# Patient Record
Sex: Female | Born: 1995 | Race: White | Hispanic: No | Marital: Single | Smoking: Current every day smoker
Health system: Southern US, Community
[De-identification: ages and names within clinical notes are randomized; demographics above are authoritative.]

## PROBLEM LIST (undated history)

## (undated) DIAGNOSIS — F411 Generalized anxiety disorder: Secondary | ICD-10-CM

## (undated) DIAGNOSIS — F900 Attention-deficit hyperactivity disorder, predominantly inattentive type: Secondary | ICD-10-CM

## (undated) HISTORY — DX: Generalized anxiety disorder: F41.1

## (undated) HISTORY — DX: Attention-deficit hyperactivity disorder, predominantly inattentive type: F90.0

---
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
## 2018-01-22 ENCOUNTER — Encounter: Payer: Self-pay | Admitting: Internal Medicine

## 2018-01-28 DIAGNOSIS — R002 Palpitations: Secondary | ICD-10-CM

## 2018-01-28 DIAGNOSIS — R000 Tachycardia, unspecified: Secondary | ICD-10-CM | POA: Insufficient documentation

## 2018-01-29 ENCOUNTER — Encounter: Payer: Self-pay | Admitting: Cardiology

## 2018-01-29 ENCOUNTER — Ambulatory Visit (INDEPENDENT_AMBULATORY_CARE_PROVIDER_SITE_OTHER): Payer: PRIVATE HEALTH INSURANCE | Admitting: Cardiology

## 2018-01-29 VITALS — BP 120/78 | HR 125 | Ht 66.75 in | Wt 138.4 lb

## 2018-01-29 DIAGNOSIS — R002 Palpitations: Secondary | ICD-10-CM

## 2018-01-29 DIAGNOSIS — F411 Generalized anxiety disorder: Secondary | ICD-10-CM | POA: Diagnosis not present

## 2018-01-29 DIAGNOSIS — R000 Tachycardia, unspecified: Secondary | ICD-10-CM

## 2018-01-29 NOTE — Progress Notes (Signed)
Cardiology Consultation:    Date:  01/29/2018   ID:  Michelle Robles, DOB October 21, 1995, MRN [id number]  PCP:  Michelle Montana, MD  Cardiologist:  Michelle Balsam, MD   Referring MD: Michelle Montana, MD   Chief Complaint  Patient presents with  . Tachycardia  Palpitations  History of Present Illness:    Michelle Robles is a 22 y.o. female who is being seen today for the evaluation of palpitations at the request of Michelle Montana, MD.  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  He was referred to [place] because of palpitations.  She is aware of her heart beating forcefully and fast.  Recently she was also diagnosed with depression.  She takes left 50 mg daily as well as Adderall.  She denies having any irregularity of the heart rate.  There is no shortness of breath no chest pain associated with it but she is very anxious when she has a sensation.  She is to exercise on the regular basis with no difficulty but lately have simply not time to do that.  She has to walk a lot while in [place] and have no difficulty doing it.   Past Medical History:  Diagnosis Date  . Anxiety state 12/01/2014  . 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 ADENOIDECTOMY Bilateral 2009    Current Medications: Current Meds  Medication Sig  . amphetamine-dextroamphetamine (ADDERALL) 10 MG tablet Take 10 mg by mouth 2 (two) times daily with a meal.  . cephALEXin (KEFLEX) 500 MG capsule Take 500 mg by mouth 3 (three) times daily.  . hydrOXYzine (ATARAX/VISTARIL) 25 MG tablet Take 12.5 mg by mouth as needed (Sleep).  . Levonorgestrel-Ethinyl Estrad (ALESSE, 28, PO) Take 1 tablet by mouth daily.  . sertraline (ZOLOFT) 50 MG tablet Take 50 mg by mouth daily.     Allergies:   Other and Sumatriptan   Social History   Socioeconomic History  . Marital status: Single    Spouse name: Not on file  .  Number of children: Not on file  . Years of education: Not on file  . Highest education level: Not on file  Occupational History  . Not on file  Social Needs  . Financial resource strain: Not on file  . Food insecurity:    Worry: Not on file    Inability: Not on file  . Transportation needs:    Medical: Not on file    Non-medical: Not on file  Tobacco Use  . Smoking status: Never Smoker  . Smokeless tobacco: Never Used  Substance and Sexual Activity  . Alcohol use: No  . Drug use: No  . Sexual activity: Never    Birth control/protection: Abstinence  Lifestyle  . Physical activity:    Days per week: Not on file    Minutes per session: Not on file  . Stress: Not on file  Relationships  . Social connections:    Talks on phone: Not on file    Gets together: Not on file    Attends religious service: Not on file    Active member of club or organization: Not on file    Attends meetings of clubs or organizations: Not on file    Relationship status: Not on file  Other Topics Concern  . Not on file  Social History Narrative   Michelle Robles is in twelfth grade at [employer]. She is  doing very well.    Living with her parents and brother. Works part-time at [employer].     Family History: The patient's family history includes ADD / ADHD in her father; Asperger's syndrome in her maternal uncle; Heart attack in her maternal grandmother; Stroke in her maternal grandmother. ROS:   Please see the history of present illness.    All 14 point review of systems negative except as described per history of present illness.  EKGs/Labs/Other Studies Reviewed:    The following studies were reviewed today:   EKG:  EKG is  ordered today.  The ekg ordered today demonstrates normal sinus rhythm normal P interval normal QS complex duration morphology  Recent Labs: No results found for requested labs within last 8760 hours.  Recent Lipid Panel No results found for: CHOL, TRIG, HDL,  CHOLHDL, VLDL, LDLCALC, LDLDIRECT  Physical Exam:    VS:  BP 120/78 (BP Location: Left Arm)   Pulse (!) 125   Ht 5' 6.75" (1.695 m)   Wt 138 lb 6.4 oz (62.8 kg)   SpO2 98%   BMI 21.84 kg/m     Wt Readings from Last 3 Encounters:  01/29/18 138 lb 6.4 oz (62.8 kg)  07/13/15 139 lb 12.8 oz (63.4 kg) (76 %, Z= 0.69)*  02/09/15 130 lb 12.8 oz (59.3 kg) (65 %, Z= 0.39)*   * Growth percentiles are based on CDC (Girls, 2-20 Years) data.     GEN:  Well nourished, well developed in no acute distress HEENT: Normal NECK: No JVD; No carotid bruits LYMPHATICS: No lymphadenopathy CARDIAC: RRR, no murmurs, no rubs, no gallops RESPIRATORY:  Clear to auscultation without rales, wheezing or rhonchi  ABDOMEN: Soft, non-tender, non-distended MUSCULOSKELETAL:  No edema; No deformity  SKIN: Warm and dry NEUROLOGIC:  Alert and oriented x 3 PSYCHIATRIC:  Normal affect   ASSESSMENT:    1. Palpitations   2. Tachycardia   3. Anxiety state    PLAN:    In order of problems listed above:  1. Palpitations/tachycardia.  I will ask her to have echocardiogram to check left ventricular ejection fraction.  She also will wear 48 hours Holter monitor to see if we have any significant arrhythmia.  I suspect Adderall is the one that is responsible for those symptoms.  Hopefully echocardiogram and monitor will be negative and then I will try to convince her to exercise on the regular basis and see if that helps with her heart rate.  I prefer not to use beta-blocker in her condition because of history of depression in the future we may be forced to cut down or maybe even discontinue Adderall.  We did talk about avoiding coffee and now she drinks only one cup a day.  We will talk also about avoiding caffeinated drinks. 2. Anxiety: It managed by psychiatrist and psychotherapist.   Medication Adjustments/Labs and Tests Ordered: Current medicines are reviewed at length with the patient today.  Concerns regarding  medicines are outlined above.  No orders of the defined types were placed in this encounter.  No orders of the defined types were placed in this encounter.   Signed, Georgeanna Lea, MD, [facility]. 01/29/2018 10:59 AM    [facility]

## 2018-01-29 NOTE — Patient Instructions (Addendum)
Medication Instructions:  Your physician recommends that you continue on your current medications as directed. Please refer to the Current Medication list given to you today.   Labwork: None  Testing/Procedures: Your physician has requested that you have an echocardiogram. Echocardiography is a painless test that uses sound waves to create images of your heart. It provides your doctor with information about the size and shape of your heart and how well your heart's chambers and valves are working. This procedure takes approximately one hour. There are no restrictions for this procedure.  Your physician has recommended that you wear a holter monitor. Holter monitors are medical devices that record the heart's electrical activity. Doctors most often use these monitors to diagnose arrhythmias. Arrhythmias are problems with the speed or rhythm of the heartbeat. The monitor is a small, portable device. You can wear one while you do your normal daily activities. This is usually used to diagnose what is causing palpitations/syncope (passing out). Wear for 48 hours.   Follow-Up: Your physician wants you to follow-up in: 4 months. You will receive a reminder letter in the mail two months in advance. If you don't receive a letter, please call our office to schedule the follow-up appointment.    Any Other Special Instructions Will Be Listed Below (If Applicable).     If you need a refill on your cardiac medications before your next appointment, please call your pharmacy.

## 2018-02-04 ENCOUNTER — Ambulatory Visit (HOSPITAL_COMMUNITY): Payer: PRIVATE HEALTH INSURANCE | Attending: Internal Medicine

## 2018-02-04 ENCOUNTER — Other Ambulatory Visit: Payer: Self-pay

## 2018-02-04 DIAGNOSIS — R000 Tachycardia, unspecified: Secondary | ICD-10-CM | POA: Diagnosis present

## 2018-02-04 DIAGNOSIS — R002 Palpitations: Secondary | ICD-10-CM | POA: Diagnosis not present

## 2018-02-05 ENCOUNTER — Ambulatory Visit: Payer: PRIVATE HEALTH INSURANCE

## 2018-02-05 DIAGNOSIS — R002 Palpitations: Secondary | ICD-10-CM

## 2018-02-11 ENCOUNTER — Telehealth: Payer: Self-pay | Admitting: *Deleted

## 2018-02-11 NOTE — Telephone Encounter (Signed)
Patient called asking for holter monitor results and to make [place] aware that her psychiatrist was faxing [place] some information. Patient wanted to make sure we received it. Patient wanted Dr. Bing Matter to advise whether or not she should continue taking adderall at this time. Explained to patient that Dr. Bing Matter was out of the office the rest of the week but I would have the covering physician, Dr. Dulce Sellar, review holter monitor results and get his recommendations.   Dr. Dulce Sellar advised that the holter monitor results showed some sinus tachycardia but nothing significant. Dr. Dulce Sellar did not feel comfortable making a recommendation about addrell and advised to have Dr. Bing Matter make that decision when he returned to the office on Monday. Patient informed. Patient concerned because she is going out of the town for the summer on Sunday. Patient states we can call her mother (DPR) with Dr. Vanetta Shawl recommendations on Monday and she would go from there. No further questions.

## 2018-02-15 ENCOUNTER — Encounter: Payer: Self-pay | Admitting: *Deleted

## 2018-02-16 NOTE — Telephone Encounter (Signed)
Dr. Bing Matter advised that as long as patient is not having palpitations, she can continue taking her current medications. Letter stating this faxed successfully to [facility] to attention Corie Chiquito, NP. Patient's mother per DPR, Tinnie Gens, informed of this. Jennie verbalized understanding. No further questions.

## 2018-09-08 ENCOUNTER — Encounter: Payer: Self-pay | Admitting: Emergency Medicine

## 2018-09-08 DIAGNOSIS — F419 Anxiety disorder, unspecified: Secondary | ICD-10-CM | POA: Insufficient documentation

## 2018-09-08 DIAGNOSIS — F3342 Major depressive disorder, recurrent, in full remission: Secondary | ICD-10-CM | POA: Insufficient documentation

## 2018-09-17 ENCOUNTER — Encounter: Payer: Self-pay | Admitting: Psychiatry

## 2018-09-17 ENCOUNTER — Ambulatory Visit (INDEPENDENT_AMBULATORY_CARE_PROVIDER_SITE_OTHER): Payer: PRIVATE HEALTH INSURANCE | Admitting: Psychiatry

## 2018-09-17 VITALS — BP 113/60 | HR 84

## 2018-09-17 DIAGNOSIS — F900 Attention-deficit hyperactivity disorder, predominantly inattentive type: Secondary | ICD-10-CM | POA: Diagnosis not present

## 2018-09-17 DIAGNOSIS — F3181 Bipolar II disorder: Secondary | ICD-10-CM | POA: Diagnosis not present

## 2018-09-17 DIAGNOSIS — F5101 Primary insomnia: Secondary | ICD-10-CM | POA: Diagnosis not present

## 2018-09-17 DIAGNOSIS — F419 Anxiety disorder, unspecified: Secondary | ICD-10-CM | POA: Diagnosis not present

## 2018-09-17 MED ORDER — LAMOTRIGINE 25 MG PO TABS: ORAL_TABLET | ORAL | 0 refills | Status: DC

## 2018-09-17 MED ORDER — SERTRALINE HCL 50 MG PO TABS: 75.0000 mg | ORAL_TABLET | Freq: Every day | ORAL | 2 refills | Status: DC

## 2018-09-17 MED ORDER — TRAZODONE HCL 100 MG PO TABS: ORAL_TABLET | ORAL | 2 refills | Status: DC

## 2018-09-17 MED ORDER — AMPHETAMINE-DEXTROAMPHETAMINE 10 MG PO TABS: 10.0000 mg | ORAL_TABLET | Freq: Two times a day (BID) | ORAL | 0 refills | Status: DC

## 2018-09-17 MED ORDER — AMPHETAMINE-DEXTROAMPHETAMINE 10 MG PO TABS: 10.0000 mg | ORAL_TABLET | Freq: Every day | ORAL | 0 refills | Status: DC

## 2018-09-17 NOTE — Progress Notes (Signed)
Michelle Robles [id number] 03-04-1996 22 y.o.  Subjective:   Patient ID:  Michelle Bertholdlizabeth C Edman is a 22 y.o. (DOB 03-04-1996) female.  Chief Complaint:  Chief Complaint  Patient presents with  . Other    Mood lability  . ADD  . Anxiety  . Sleeping Problem    HPI Michelle Bertholdlizabeth C Bringhurst presents to the office today for follow-up of mood, anxiety, and ADD. She reports that her mood is "up and down." Has been questioning if she has Bipolar II. Reports that periods where she is more "hyper, excited" sometimes without precipitating event or excessive considering circumstances. Denies decreased need for sleep but will struggle to fall asleep because she is excited and having ideas. Reports periods of increased energy and ideas with some excessive goal-directed activity (ie., going to school full -time and active in sorority and then was considering taking a second job). Reports that she is more talkative but not excessively talkative. Denies impulsive or risky behavior. Reports that she is "thinking more clearly" during those times. Denies excessive spending. Reports periods of elevated mood last about 3 hours on average- "the longer they last, the more mellow they are" in terms of duration and severity of mood. Reports that mood will "plummet" at times, particularly after periods where mood is slightly elevated. Reports that her mood tends to be either slightly elevated or mildly depressed, with infrequent episodes of severe depression. Reports episodes of hypomania happen about once every 1-2 weeks. Reports since Sertraline was increased mood is more labile with less periods of severe depression and more times where mood is slightly elevated.   She reports that her anxiety is "fine." Reports having thoughts that no one likes her. Reports thinking "if I don't get this done, I will fail." Reports that she occasionally feels tightness in her chest with anxiety and times of severe depression. Reports some  anxiety that one of her grandparents will die soon after her dog died recently. Reports occasionally having thought that she is going to die while driving. Continues to have some dissociation.  She reports that her concentration has not been good. She reports that she "cannot function" when she does not take Adderall and will notice that she feels tired and "foggy" when she does not take it. Reports that Adderall helps her focus on "social things" but struggles with focusing on school work.   Energy, motivation, and sleep fluctuate based on mood. Appetite also fluctuates "but I don't know when" and has been unable to determine a pattern. Reports eating excessively at times and other times will only eat one meal qd. Reports Trazodone has been helpful for her insomnia.   Reports occasional passive death wishes. Denies SI.   "If one thing bad happens, it sends me down a depression."  Reports that she started seeing a different therapist at [facility] and has had one visit.   Past Psychiatric Medication Trials: Strattera Vyvanse Adderall Vistaril Prozac Wellbutrin-no significant improvement Sertraline Melatonin Trazodone   Review of Systems:  Review of Systems  Cardiovascular:       Reports less frequent palpitations and that it has not been occurring.   Musculoskeletal: Negative for gait problem.  Neurological: Positive for tremors.       Increased tremor since increase in Sertraline  Psychiatric/Behavioral:       Please refer to HPI    Medications: I have reviewed the patient's current medications.  Current Outpatient Medications  Medication Sig Dispense Refill  . amphetamine-dextroamphetamine (ADDERALL) 10  MG tablet Take 1 tablet (10 mg total) by mouth 2 (two) times daily with a meal. 60 tablet 0  . Levonorgestrel-Ethinyl Estrad (ALESSE, 28, PO) Take 1 tablet by mouth daily.    . sertraline (ZOLOFT) 50 MG tablet Take 1.5 tablets (75 mg total) by mouth daily. 45 tablet 2   . traZODone (DESYREL) 100 MG tablet Take 1/2-1 tab po qd 30 tablet 2  . [START ON 10/15/2018] amphetamine-dextroamphetamine (ADDERALL) 10 MG tablet Take 1 tablet (10 mg total) by mouth daily with breakfast. 30 tablet 0  . lamoTRIgine (LAMICTAL) 25 MG tablet Take 1 tab po qd x 2 weeks, then increase to 2 tabs po qd 60 tablet 0   No current facility-administered medications for this visit.     Medication Side Effects: Sleep Problems  Allergies:  Allergies  Allergen Reactions  . Other     Seasonal Allergies, Avocados cause stomach pain and nausea   . Sumatriptan     Past Medical History:  Diagnosis Date  . Anxiety state 12/01/2014  . 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 Anxiety disorder Mother   . ADD / ADHD Father   . Depression Father   . Asperger's syndrome Maternal Uncle   . Stroke Maternal Grandmother   . Heart attack Maternal Grandmother   . Dementia Paternal Grandfather   . ADD / ADHD Paternal Grandfather     Social History   Socioeconomic History  . Marital status: Single    Spouse name: Not on file  . Number of children: Not on file  . Years of education: Not on file  . Highest education level: Not on file  Occupational History  . Not on file  Social Needs  . Financial resource strain: Not on file  . Food insecurity:    Worry: Not on file    Inability: Not on file  . Transportation needs:    Medical: Not on file    Non-medical: Not on file  Tobacco Use  . Smoking status: Never Smoker  . Smokeless tobacco: Never Used  Substance and Sexual Activity  . Alcohol use: Yes    Comment: 1-2 times a week  . Drug use: No  . Sexual activity: Never    Birth control/protection: Abstinence  Lifestyle  . Physical activity:    Days per week: Not on file    Minutes per session: Not on file  . Stress: Not on file  Relationships  . Social connections:    Talks  on phone: Not on file    Gets together: Not on file    Attends religious service: Not on file    Active member of club or organization: Not on file    Attends meetings of clubs or organizations: Not on file    Relationship status: Not on file  . Intimate partner violence:    Fear of current or ex partner: Not on file    Emotionally abused: Not on file    Physically abused: Not on file    Forced sexual activity: Not on file  Other Topics Concern  . Not on file  Social History Narrative   Lanora Manislizabeth is in twelfth grade at [employer]. She is doing very well.    Living with her parents and brother. Works part-time at [employer].    Past Medical History, Surgical history, Social history, and Family history were reviewed and updated as  appropriate.   Please see review of systems for further details on the patient's review from today.   Objective:   Physical Exam:  BP 113/60   Pulse 84   Physical Exam Constitutional:      General: She is not in acute distress.    Appearance: She is well-developed.  Musculoskeletal:        General: No deformity.  Neurological:     Mental Status: She is alert and oriented to person, place, and time.     Coordination: Coordination normal.  Psychiatric:        Mood and Affect: Mood is anxious. Affect is not labile, blunt, angry or inappropriate.        Speech: Speech normal.        Behavior: Behavior normal.        Thought Content: Thought content normal. Thought content does not include homicidal or suicidal ideation. Thought content does not include homicidal or suicidal plan.        Judgment: Judgment normal.     Comments: Mood presents as mildly depressed Insight intact. No auditory or visual hallucinations. No delusions.      Lab Review:  No results found for: NA, K, CL, CO2, GLUCOSE, BUN, CREATININE, CALCIUM, PROT, ALBUMIN, AST, ALT, ALKPHOS, BILITOT, GFRNONAA, GFRAA  No results found for: WBC, RBC, HGB, HCT, PLT, MCV, MCH,  MCHC, RDW, LYMPHSABS, MONOABS, EOSABS, BASOSABS  No results found for: POCLITH, LITHIUM   No results found for: PHENYTOIN, PHENOBARB, VALPROATE, CBMZ   .res Assessment: Plan:   Counseled patient regarding potential benefits, risks, and side effects of Lamictal to include potential risk of Stevens-Johnson syndrome. Advised patient to stop taking Lamictal and contact office immediately if rash develops and to seek urgent medical attention if rash is severe and/or spreading quickly.  Will start Lamictal 25 mg once daily for 2 weeks, then increase to 50 mg daily for mood signs and symptoms. Continue sertraline 75 mg daily for mood and anxiety. Continue Adderall 10 mg twice daily for attention deficit. Continue trazodone as needed for insomnia. Patient to follow-up in 4 weeks or sooner if clinically indicated. Attention deficit hyperactivity disorder (ADHD), predominantly inattentive type - Plan: amphetamine-dextroamphetamine (ADDERALL) 10 MG tablet, amphetamine-dextroamphetamine (ADDERALL) 10 MG tablet  Bipolar II disorder (HCC) - Plan: lamoTRIgine (LAMICTAL) 25 MG tablet, sertraline (ZOLOFT) 50 MG tablet  Anxiety disorder, unspecified type - Plan: sertraline (ZOLOFT) 50 MG tablet  Primary insomnia - Plan: traZODone (DESYREL) 100 MG tablet  Please see After Visit Summary for patient specific instructions.  Future Appointments  Date Time Provider Department Center  10/29/2018  3:30 PM Corie Chiquito, PMHNP [facility] None    No orders of the defined types were placed in this encounter.     -------------------------------

## 2018-10-26 ENCOUNTER — Other Ambulatory Visit: Payer: Self-pay | Admitting: Psychiatry

## 2018-10-26 DIAGNOSIS — F3181 Bipolar II disorder: Secondary | ICD-10-CM

## 2018-10-29 ENCOUNTER — Ambulatory Visit: Payer: PRIVATE HEALTH INSURANCE | Admitting: Psychiatry

## 2018-10-29 ENCOUNTER — Telehealth: Payer: Self-pay | Admitting: Psychiatry

## 2018-10-29 DIAGNOSIS — F3181 Bipolar II disorder: Secondary | ICD-10-CM

## 2018-10-29 MED ORDER — LAMOTRIGINE 100 MG PO TABS: ORAL_TABLET | ORAL | 1 refills | Status: DC

## 2018-10-29 NOTE — Telephone Encounter (Signed)
Mom Tinnie Gens stated patient needs refill on medication she was just starting on Jessica spoke with her about changing meds.

## 2018-11-04 ENCOUNTER — Ambulatory Visit: Payer: PRIVATE HEALTH INSURANCE | Admitting: Psychiatry

## 2018-11-16 ENCOUNTER — Telehealth: Payer: Self-pay | Admitting: Psychiatry

## 2018-11-16 DIAGNOSIS — F900 Attention-deficit hyperactivity disorder, predominantly inattentive type: Secondary | ICD-10-CM

## 2018-11-16 MED ORDER — AMPHETAMINE-DEXTROAMPHETAMINE 10 MG PO TABS: 10.0000 mg | ORAL_TABLET | Freq: Two times a day (BID) | ORAL | 0 refills | Status: DC

## 2018-11-16 NOTE — Telephone Encounter (Signed)
Please refill Adderall 10mg  RX to [facility] in [place]. Appt  2/25

## 2018-11-23 ENCOUNTER — Encounter: Payer: Self-pay | Admitting: Psychiatry

## 2018-11-23 ENCOUNTER — Ambulatory Visit (INDEPENDENT_AMBULATORY_CARE_PROVIDER_SITE_OTHER): Payer: PRIVATE HEALTH INSURANCE | Admitting: Psychiatry

## 2018-11-23 DIAGNOSIS — F419 Anxiety disorder, unspecified: Secondary | ICD-10-CM | POA: Diagnosis not present

## 2018-11-23 DIAGNOSIS — F3181 Bipolar II disorder: Secondary | ICD-10-CM | POA: Diagnosis not present

## 2018-11-23 DIAGNOSIS — F5101 Primary insomnia: Secondary | ICD-10-CM

## 2018-11-23 DIAGNOSIS — F900 Attention-deficit hyperactivity disorder, predominantly inattentive type: Secondary | ICD-10-CM

## 2018-11-23 MED ORDER — LURASIDONE HCL 40 MG PO TABS: 40.0000 mg | ORAL_TABLET | Freq: Every day | ORAL | 0 refills | Status: DC

## 2018-11-23 MED ORDER — LURASIDONE HCL 20 MG PO TABS: 20.0000 mg | ORAL_TABLET | Freq: Every day | ORAL | 0 refills | Status: DC

## 2018-11-23 MED ORDER — TRAZODONE HCL 100 MG PO TABS: ORAL_TABLET | ORAL | 2 refills | Status: DC

## 2018-11-23 MED ORDER — AMPHETAMINE-DEXTROAMPHETAMINE 10 MG PO TABS: 10.0000 mg | ORAL_TABLET | Freq: Two times a day (BID) | ORAL | 0 refills | Status: DC

## 2018-11-23 MED ORDER — LAMOTRIGINE 100 MG PO TABS: 100.0000 mg | ORAL_TABLET | Freq: Every day | ORAL | 1 refills | Status: DC

## 2018-11-23 MED ORDER — SERTRALINE HCL 50 MG PO TABS: 75.0000 mg | ORAL_TABLET | Freq: Every day | ORAL | 2 refills | Status: DC

## 2018-11-23 NOTE — Progress Notes (Signed)
Michelle Robles [id number] 06-01-1996 22 y.o.  Subjective:   Patient ID:  Michelle Robles is a 22 y.o. (DOB 03-Aug-1996) female.  Chief Complaint:  Chief Complaint  Patient presents with  . Anxiety  . Depression    HPI Michelle Robles presents to the office today for follow-up of Mood, anxiety, insomnia, and ADD. She reports "this is the worst I have ever felt... just feeling really trapped."  Reports that her mood has been depressed. Reports mood will start to lift somewhat when she is spending time with certain friends- "but still not 100%." She reports that she has had periods where mood is elevated but not in the last 1.5 weeks. Reports some mood lability since last visit, such as having a 3 day period of depression followed by period of being "ecstatic." She reports "I want to do impulsive things." Reports that she has had thoughts several times this week of getting in her car and driving to [place].   She reports that she has gone to one class since last Monday. "I can't make myself go." Reports at times feeling "bored with life" and not enjoying things. She reports that she is stressed about missing classes. Reports some indifference about classes "I don't care." Reports that energy is low at time and motivation has been "very low." She reports that her appetite "changes a lot." Reports that she may have a 3-day period without eating a full meal and will snack, and other times is eating 3 full meals a day. Reports that she is currently at her highest weight. Reports that she wants to adopt healthier habits "but I don't have the energy or the motivation." Reports some days she has been unable to get up in the morning and go to class despite setting 10 alarms. Reports vague passive suicidal thoughts. Denies suicidal ideation, intent, or plan.   She reports that she had a few recent panic attacks. She denies generalized anxiety.   Reports feeling as if "there is no way out" of  current situation and school. She reports after session with therapist she recognizes she was wanting things to be 100%.  She reports that she has not been sleeping well. She reports difficulty falling asleep. She reports that sometimes she is does not fall asleep right away and other times she will sleep for 5 consecutive hours. She reports that she has been working on sleep hygiene and this has been helpful. Estimates sleeping 4-6 hours.   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 She reports that on Sunday she had anxiety with the thought of returning to school in [place] and had increased heart rate, sweating, and shaking. She reports that she then had a severe panic attack where she was unable to breathe. Reports that she had crying episode later on Sunday. She reports that she did not feel comfortable returning to [place] that night. Reports the next day she went to [place] [employer] and then came to parents' home. Saw her therapist earlier today.   Past Psychiatric Medication Trials: Strattera Vyvanse Adderall Vistaril Prozac Wellbutrin-no significant improvement Sertraline Melatonin Trazodone Lamictal- Reports that she noticed some improvement in the first 2-3 weeks and then noticed less improvement with increase.  Review of Systems:  Review of Systems  Gastrointestinal: Negative.   Musculoskeletal: Negative for gait problem.  Skin: Negative for rash.  Neurological:       Reports occasional  mild tremor and it is exacerbated by higher amounts of caffeine  Psychiatric/Behavioral:       Please refer to HPI    Medications: I have reviewed the patient's current medications.  Current Outpatient Medications  Medication Sig Dispense Refill  . [START ON 12/14/2018] amphetamine-dextroamphetamine (ADDERALL) 10 MG tablet Take 1 tablet (10 mg total) by mouth 2 (two) times daily with a meal for 30  days. 60 tablet 0  . lamoTRIgine (LAMICTAL) 100 MG tablet Take 1 tablet (100 mg total) by mouth daily for 30 days. 30 tablet 1  . traZODone (DESYREL) 100 MG tablet Take 1/2-1 tab po qd 30 tablet 2  . amphetamine-dextroamphetamine (ADDERALL) 10 MG tablet Take 1 tablet (10 mg total) by mouth daily with breakfast. 30 tablet 0  . Levonorgestrel-Ethinyl Estrad (ALESSE, 28, PO) Take 1 tablet by mouth daily.    . sertraline (ZOLOFT) 50 MG tablet Take 1.5 tablets (75 mg total) by mouth daily for 30 days. 45 tablet 2   No current facility-administered medications for this visit.     Medication Side Effects: None  Allergies:  Allergies  Allergen Reactions  . Other     Seasonal Allergies, Avocados cause stomach pain and nausea   . Sumatriptan     Past Medical History:  Diagnosis Date  . Anxiety state 12/01/2014  . 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 Anxiety disorder Mother   . ADD / ADHD Father   . Depression Father   . Asperger's syndrome Maternal Uncle   . Stroke Maternal Grandmother   . Heart attack Maternal Grandmother   . Dementia Paternal Grandfather   . ADD / ADHD Paternal Grandfather     Social History   Socioeconomic History  . Marital status: Single    Spouse name: Not on file  . Number of children: Not on file  . Years of education: Not on file  . Highest education level: Not on file  Occupational History  . Not on file  Social Needs  . Financial resource strain: Not on file  . Food insecurity:    Worry: Not on file    Inability: Not on file  . Transportation needs:    Medical: Not on file    Non-medical: Not on file  Tobacco Use  . Smoking status: Never Smoker  . Smokeless tobacco: Never Used  Substance and Sexual Activity  . Alcohol use: Yes    Comment: 1-2 times a week  . Drug use: No  . Sexual activity: Never    Birth control/protection: Abstinence   Lifestyle  . Physical activity:    Days per week: Not on file    Minutes per session: Not on file  . Stress: Not on file  Relationships  . Social connections:    Talks on phone: Not on file    Gets together: Not on file    Attends religious service: Not on file    Active member of club or organization: Not on file    Attends meetings of clubs or organizations: Not on file    Relationship status: Not on file  . Intimate partner violence:    Fear of current or ex partner: Not on file    Emotionally abused: Not on file    Physically abused: Not on file    Forced sexual activity: Not on file  Other Topics Concern  . Not on file  Social  History Narrative   Guiseppina is in twelfth grade at [employer]. She is doing very well.    Living with her parents and brother. Works part-time at [employer].    Past Medical History, Surgical history, Social history, and Family history were reviewed and updated as appropriate.   Please see review of systems for further details on the patient's review from today.   Objective:   Physical Exam:  BP 117/81   Pulse 85   Physical Exam Constitutional:      General: She is not in acute distress.    Appearance: She is well-developed.  Musculoskeletal:        General: No deformity.  Neurological:     Mental Status: She is alert and oriented to person, place, and time.     Coordination: Coordination normal.  Psychiatric:        Attention and Perception: Attention and perception normal. She does not perceive auditory or visual hallucinations.        Mood and Affect: Mood is depressed. Mood is not anxious. Affect is blunt. Affect is not labile, angry or inappropriate.        Speech: Speech normal.        Behavior: Behavior normal.        Thought Content: Thought content normal. Thought content does not include homicidal or suicidal ideation. Thought content does not include homicidal or suicidal plan.        Cognition and Memory:  Cognition and memory normal.        Judgment: Judgment normal.     Comments: Insight intact. No delusions.      Lab Review:  No results found for: NA, K, CL, CO2, GLUCOSE, BUN, CREATININE, CALCIUM, PROT, ALBUMIN, AST, ALT, ALKPHOS, BILITOT, GFRNONAA, GFRAA  No results found for: WBC, RBC, HGB, HCT, PLT, MCV, MCH, MCHC, RDW, LYMPHSABS, MONOABS, EOSABS, BASOSABS  No results found for: POCLITH, LITHIUM   No results found for: PHENYTOIN, PHENOBARB, VALPROATE, CBMZ   .res Assessment: Plan:   Discussed potential benefits, risks, and side effects of Latuda. Discussed potential metabolic side effects associated with atypical antipsychotics, as well as potential risk for movement side effects. Advised pt to contact office if movement side effects occur.  Will start Latuda 20 mg qd with supper x 1 week, then increase to 40 mg po qd for mood s/s.  Will decrease Lamictal to 100 mg po qd since pt reports that mood was less depressed at lower doses.  Will continue Sertraline 75 mg po qd for mood and anxiety.  Will continue Adderall 10 mg po BID for ADD.  Patient advised to contact office with any questions, adverse effects, or acute worsening in signs and symptoms.   Bipolar II disorder (HCC) - Plan: sertraline (ZOLOFT) 50 MG tablet, lamoTRIgine (LAMICTAL) 100 MG tablet  Anxiety disorder, unspecified type - Plan: sertraline (ZOLOFT) 50 MG tablet  Attention deficit hyperactivity disorder (ADHD), predominantly inattentive type - Plan: amphetamine-dextroamphetamine (ADDERALL) 10 MG tablet  Primary insomnia - Plan: traZODone (DESYREL) 100 MG tablet  Please see After Visit Summary for patient specific instructions.  No future appointments.  No orders of the defined types were placed in this encounter.     -------------------------------

## 2018-11-30 ENCOUNTER — Telehealth: Payer: Self-pay | Admitting: Psychiatry

## 2018-11-30 NOTE — Telephone Encounter (Signed)
Patient called and said that she is taking 5 medications for mental health. She feels it is making her disassociation worse. She is having memory issues can't tell if something is real or a dream. Please give her a call at [phone]

## 2018-12-01 NOTE — Telephone Encounter (Signed)
Unable to reach pt or leave voicemail, will try back later

## 2018-12-01 NOTE — Telephone Encounter (Signed)
Tried to reach pt again, voice mail still full and no answer by pt.

## 2018-12-02 NOTE — Telephone Encounter (Signed)
Left voicemail with information and to call back to schedule her follow up as well

## 2018-12-20 ENCOUNTER — Telehealth: Payer: Self-pay | Admitting: Psychiatry

## 2018-12-20 ENCOUNTER — Other Ambulatory Visit: Payer: Self-pay

## 2018-12-20 NOTE — Telephone Encounter (Signed)
Pt had samples of Latuda but she is about to run out. She would like them sent to [facility] in [place], [place] on [address]

## 2018-12-20 NOTE — Telephone Encounter (Signed)
Need to verify dosage 20mg  or 40mg 

## 2018-12-21 ENCOUNTER — Other Ambulatory Visit: Payer: Self-pay

## 2018-12-21 ENCOUNTER — Telehealth: Payer: Self-pay | Admitting: Psychiatry

## 2018-12-21 DIAGNOSIS — F3181 Bipolar II disorder: Secondary | ICD-10-CM

## 2018-12-21 MED ORDER — LURASIDONE HCL 40 MG PO TABS: 40.0000 mg | ORAL_TABLET | Freq: Every day | ORAL | 0 refills | Status: DC

## 2018-12-21 NOTE — Telephone Encounter (Signed)
Jacqulene returned your call and said that she is taking 40mg .  She ran out of 20mg .  She is doing find on the 40mg .

## 2018-12-21 NOTE — Telephone Encounter (Signed)
Left voicemail to check dosage

## 2018-12-21 NOTE — Telephone Encounter (Signed)
rx submitted to pharmacy, see previous message

## 2018-12-21 NOTE — Telephone Encounter (Signed)
She called back and is doing well on 40mg , will submit to her pharmacy

## 2018-12-24 ENCOUNTER — Ambulatory Visit: Payer: PRIVATE HEALTH INSURANCE | Admitting: Psychiatry

## 2018-12-27 ENCOUNTER — Telehealth: Payer: Self-pay | Admitting: Psychiatry

## 2018-12-27 NOTE — Telephone Encounter (Signed)
rx that was sent to pharmacy for Michelle Robles is to much for patient to purchase. She wants to know if there is another med she can get.

## 2018-12-27 NOTE — Telephone Encounter (Signed)
Left voicemail asking if she downloaded card. Instructed to call back with information

## 2018-12-27 NOTE — Telephone Encounter (Signed)
Has pt downloaded the copay insurance card from [employer]? This may help in her savings. Will call pt to check

## 2018-12-28 NOTE — Telephone Encounter (Signed)
Prior authorization submitted, pending approval.

## 2019-01-03 ENCOUNTER — Ambulatory Visit: Payer: PRIVATE HEALTH INSURANCE | Admitting: Psychiatry

## 2019-01-03 ENCOUNTER — Telehealth: Payer: Self-pay | Admitting: Psychiatry

## 2019-01-03 NOTE — Telephone Encounter (Signed)
Pt called to check on status of PA. Also, out of meds since Friday. Ask for samples Latuda 40 mg 1/d mom to pick up at office.

## 2019-01-03 NOTE — Telephone Encounter (Signed)
Should have refills on file will check with pharmacy

## 2019-01-03 NOTE — Telephone Encounter (Signed)
Pt called  refill needed on Trazodone 100 mg 1/2 tab daily. Pharm [facility]  [address]

## 2019-01-03 NOTE — Telephone Encounter (Signed)
Jola Babinski pulled samples and is resubmitting PA

## 2019-01-11 ENCOUNTER — Telehealth: Payer: Self-pay | Admitting: Psychiatry

## 2019-01-11 ENCOUNTER — Other Ambulatory Visit: Payer: Self-pay

## 2019-01-11 DIAGNOSIS — F5101 Primary insomnia: Secondary | ICD-10-CM

## 2019-01-11 MED ORDER — TRAZODONE HCL 100 MG PO TABS: ORAL_TABLET | ORAL | 2 refills | Status: AC

## 2019-01-11 NOTE — Telephone Encounter (Signed)
Refilled submitted. Yes she's correct her PA was denied by her insurance

## 2019-01-11 NOTE — Telephone Encounter (Signed)
Patient states she is out of Trazidone need refill to be sent to [facility] in [place], insurance will not cover Latuda a prior Berkley Harvey was sent from [facility] last week.

## 2019-01-11 NOTE — Telephone Encounter (Signed)
Working on PA to [employer] or change.

## 2019-01-14 ENCOUNTER — Telehealth: Payer: Self-pay

## 2019-01-14 NOTE — Telephone Encounter (Signed)
Prior authorization approved latuda 01/13/2019-01/12/2020

## 2019-01-14 NOTE — Telephone Encounter (Signed)
Prior authorization resubmitted for Latuda 40 mg through [employer] approved effective 01/13/2019-01/13/2020

## 2019-01-26 ENCOUNTER — Encounter: Payer: Self-pay | Admitting: Psychiatry

## 2019-01-26 ENCOUNTER — Ambulatory Visit (INDEPENDENT_AMBULATORY_CARE_PROVIDER_SITE_OTHER): Payer: 59 | Admitting: Psychiatry

## 2019-01-26 ENCOUNTER — Other Ambulatory Visit: Payer: Self-pay

## 2019-01-26 DIAGNOSIS — F419 Anxiety disorder, unspecified: Secondary | ICD-10-CM | POA: Diagnosis not present

## 2019-01-26 DIAGNOSIS — F5101 Primary insomnia: Secondary | ICD-10-CM | POA: Diagnosis not present

## 2019-01-26 DIAGNOSIS — F3181 Bipolar II disorder: Secondary | ICD-10-CM | POA: Diagnosis not present

## 2019-01-26 DIAGNOSIS — F900 Attention-deficit hyperactivity disorder, predominantly inattentive type: Secondary | ICD-10-CM

## 2019-01-26 MED ORDER — LURASIDONE HCL 40 MG PO TABS: 40.0000 mg | ORAL_TABLET | Freq: Every day | ORAL | 1 refills | Status: AC

## 2019-01-26 MED ORDER — LAMOTRIGINE 100 MG PO TABS: ORAL_TABLET | ORAL | 1 refills | Status: AC

## 2019-01-26 MED ORDER — AMPHETAMINE-DEXTROAMPHETAMINE 10 MG PO TABS: 10.0000 mg | ORAL_TABLET | Freq: Two times a day (BID) | ORAL | 0 refills | Status: DC

## 2019-01-26 NOTE — Progress Notes (Signed)
Michelle Robles [id number] 01/03/1996 22 y.o.  Virtual Visit via Video Note  I connected with@ on 01/26/19 at 10:30 AM EDT by a video enabled telemedicine application and verified that I am speaking with the correct person using two identifiers.   I discussed the limitations of evaluation and management by telemedicine and the availability of in person appointments. The patient expressed understanding and agreed to proceed.  I discussed the assessment and treatment plan with the patient. The patient was provided an opportunity to ask questions and all were answered. The patient agreed with the plan and demonstrated an understanding of the instructions.   The patient was advised to call back or seek an in-person evaluation if the symptoms worsen or if the condition fails to improve as anticipated.  I provided 30 minutes of non-face-to-face time during this encounter.  The patient was located at home.  The provider was located at home.   Michelle ChiquitoJessica Saraih Robles, PMHNP   Subjective:   Patient ID:  Michelle Robles is a [age] y.o. (DOB 01/03/1996) female.  Chief Complaint:  Chief Complaint  Patient presents with  . Depression  . Anxiety  . ADD    HPI Michelle Robles presents to the office today for follow-up of mood and anxiety.   She reports "I'm doing better than I thought I would." She reports that her mood "varies." She reports that she had a several day periods of low mood and had some losses. She reports that her mood improved after talking with therapist.   She reports that she has had some difficulty with motivation and procrastination with school work. Reports that she likes doing work remotely. Reports that she misses being around others.   Reports that she continues to work and does not particularly like work. Reports that she had 2 panic attacks yesterday at work and then a panic attack last week. She reports that work and being out out of the house seems to trigger her  anxiety. She reports that her anxiety has been higher than usual and thinks that it is related to situational stressors. She reports that she is having anxiety in response to "rational" triggers. She reports that she has been having some increase in dissociation recently.   Reports that Latuda seemed to help her get out of episode of severe depression- She reports that now depression does not occur as often or last as long. Denies any significant manic s/s. She reports some weight gain with Latuda. She reports that she has had some increase in appetite. Has been eating some more during the pandemic. Reports that her energy and motivation "varies... but it has not been unnaturally up or down." She reports poor concentration and was up late completing a paper. Reports that it recently took about 4 hours to read 20 pages in a textbook recently. Reports that she is not sure if concentration is impaired due to studying online and from her room. Concentration is also worse when anxiety is higher.   She reports that she is sleeping more, usually 10- midnight, and sleeping until 9-11 am.  Notices that she experiences some "excitement when she takes Adderall." She reports that Adderall is effective, but no longer as effective as it once was. Denies SI.   Past Psychiatric Medication Trials: Strattera Vyvanse Adderall Vistaril Prozac Wellbutrin-no significant improvement Sertraline Melatonin Trazodone Lamictal- Reports that she noticed some improvement in the first 2-3 weeks and then noticed less improvement with increase.  Review of Systems:  Review  of Systems  Musculoskeletal: Negative for gait problem.  Neurological: Positive for tremors.       Mild tremor in hands and arms  Psychiatric/Behavioral:       Please refer to HPI    Medications: I have reviewed the patient's current medications.  Current Outpatient Medications  Medication Sig Dispense Refill  . traZODone (DESYREL) 100 MG tablet  Take 1/2-1 tab po qd 30 tablet 2  . amphetamine-dextroamphetamine (ADDERALL) 10 MG tablet Take 1 tablet (10 mg total) by mouth 2 (two) times daily with a meal for 30 days. 60 tablet 0  . [START ON 02/23/2019] amphetamine-dextroamphetamine (ADDERALL) 10 MG tablet Take 1 tablet (10 mg total) by mouth 2 (two) times a day. 120 tablet 0  . lamoTRIgine (LAMICTAL) 100 MG tablet Stop after 14 days 30 tablet 1  . Levonorgestrel-Ethinyl Estrad (ALESSE, 28, PO) Take 1 tablet by mouth daily.    Marland Kitchen lurasidone (LATUDA) 40 MG TABS tablet Take 1 tablet (40 mg total) by mouth daily with breakfast for 30 days. 30 tablet 1  . sertraline (ZOLOFT) 50 MG tablet Take 1.5 tablets (75 mg total) by mouth daily for 30 days. 45 tablet 2   No current facility-administered medications for this visit.     Medication Side Effects: Other: Some weight gain. Notices slight tremor  Allergies:  Allergies  Allergen Reactions  . Other     Seasonal Allergies, Avocados cause stomach pain and nausea   . Sumatriptan     Past Medical History:  Diagnosis Date  . Anxiety state 12/01/2014  . 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 Anxiety disorder Mother   . ADD / ADHD Father   . Depression Father   . Asperger's syndrome Maternal Uncle   . Stroke Maternal Grandmother   . Heart attack Maternal Grandmother   . Dementia Paternal Grandfather   . ADD / ADHD Paternal Grandfather     Social History   Socioeconomic History  . Marital status: Single    Spouse name: Not on file  . Number of children: Not on file  . Years of education: Not on file  . Highest education level: Not on file  Occupational History  . Not on file  Social Needs  . Financial resource strain: Not on file  . Food insecurity:    Worry: Not on file    Inability: Not on file  . Transportation needs:    Medical: Not on file    Non-medical: Not on file   Tobacco Use  . Smoking status: Never Smoker  . Smokeless tobacco: Never Used  Substance and Sexual Activity  . Alcohol use: Yes    Comment: 1-2 times a week  . Drug use: No  . Sexual activity: Never    Birth control/protection: Abstinence  Lifestyle  . Physical activity:    Days per week: Not on file    Minutes per session: Not on file  . Stress: Not on file  Relationships  . Social connections:    Talks on phone: Not on file    Gets together: Not on file    Attends religious service: Not on file    Active member of club or organization: Not on file    Attends meetings of clubs or organizations: Not on file    Relationship status: Not on file  . Intimate partner violence:    Fear of current or ex partner:  Not on file    Emotionally abused: Not on file    Physically abused: Not on file    Forced sexual activity: Not on file  Other Topics Concern  . Not on file  Social History Narrative   Maysa is in twelfth grade at [employer]. She is doing very well.    Living with her parents and brother. Works part-time at [employer].    Past Medical History, Surgical history, Social history, and Family history were reviewed and updated as appropriate.   Please see review of systems for further details on the patient's review from today.   Objective:   Physical Exam:  There were no vitals taken for this visit.  Physical Exam Neurological:     Mental Status: She is alert and oriented to person, place, and time.     Cranial Nerves: No dysarthria.  Psychiatric:        Attention and Perception: Attention normal.        Speech: Speech normal.        Behavior: Behavior is cooperative.        Thought Content: Thought content normal. Thought content is not paranoid or delusional. Thought content does not include homicidal or suicidal ideation. Thought content does not include homicidal or suicidal plan.        Cognition and Memory: Cognition and memory normal.         Judgment: Judgment normal.     Comments: Mood presents as mildly depressed.     Lab Review:  No results found for: NA, K, CL, CO2, GLUCOSE, BUN, CREATININE, CALCIUM, PROT, ALBUMIN, AST, ALT, ALKPHOS, BILITOT, GFRNONAA, GFRAA  No results found for: WBC, RBC, HGB, HCT, PLT, MCV, MCH, MCHC, RDW, LYMPHSABS, MONOABS, EOSABS, BASOSABS  No results found for: POCLITH, LITHIUM   No results found for: PHENYTOIN, PHENOBARB, VALPROATE, CBMZ   .res Assessment: Plan:   Discussed treatment options with patient.  She reports that she would like to continue Latuda since this has been helpful for her mood signs and symptoms and that benefits are currently outweighing any side effects. Will decrease Lamictal to 50 mg po qd x 2 weeks, then stop due to no significant improvement in signs and symptoms and possible side effects. Continue sertraline 75 mg daily. Continue Adderall 10 mg twice daily for ADD. Continue trazodone for insomnia.  Patient to follow-up in 6 weeks or sooner if clinically indicated. Patient advised to contact office with any questions, adverse effects, or acute worsening in signs and symptoms.  Anxiety disorder, unspecified type  Bipolar II disorder (HCC) - Plan: lamoTRIgine (LAMICTAL) 100 MG tablet, lurasidone (LATUDA) 40 MG TABS tablet  Attention deficit hyperactivity disorder (ADHD), predominantly inattentive type - Plan: amphetamine-dextroamphetamine (ADDERALL) 10 MG tablet, amphetamine-dextroamphetamine (ADDERALL) 10 MG tablet  Primary insomnia  Please see After Visit Summary for patient specific instructions.  No future appointments.  No orders of the defined types were placed in this encounter.     -------------------------------

## 2019-02-25 ENCOUNTER — Telehealth: Payer: Self-pay | Admitting: Psychiatry

## 2019-02-25 DIAGNOSIS — F900 Attention-deficit hyperactivity disorder, predominantly inattentive type: Secondary | ICD-10-CM

## 2019-02-25 MED ORDER — AMPHETAMINE-DEXTROAMPHETAMINE 10 MG PO TABS: 10.0000 mg | ORAL_TABLET | Freq: Two times a day (BID) | ORAL | 0 refills | Status: DC

## 2019-02-25 NOTE — Telephone Encounter (Signed)
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  She reports that it appears that there is another prescription in their system that has not been voided by [facility] in [place].  Provided clarification that there should not be any scripts on file in [place] and that patient wishes to have Adderall prescription filled in [place].  Pharmacist reports that she will contact pharmacist in [place] to void prescription on file.

## 2019-02-25 NOTE — Telephone Encounter (Signed)
Clarification of qty of Adderall sent to pharmacy

## 2019-02-25 NOTE — Telephone Encounter (Signed)
[facility] on [address] [place] called to report that they can't fill the prescription for Adderall sent today for Merida Slates because is it rejecting as too soon to fill.  Apparently there is an outstanding prescription sent 01/26/19 to [facility] in [place], [place] for #120 tabs.  There a prescription filled by [facility] in [place] on 01/26/19 for #60 tabs but the one for #120 is still on file, so [facility] here can't fill the one sent today.  [facility] in [place] will need to be told to cancel the one for #120 tabs which would have been done in error.

## 2019-09-27 ENCOUNTER — Ambulatory Visit: Payer: PRIVATE HEALTH INSURANCE | Admitting: Cardiology

## 2019-10-20 ENCOUNTER — Other Ambulatory Visit: Payer: Self-pay

## 2019-10-20 ENCOUNTER — Encounter: Payer: Self-pay | Admitting: Cardiology

## 2019-10-20 ENCOUNTER — Ambulatory Visit (INDEPENDENT_AMBULATORY_CARE_PROVIDER_SITE_OTHER): Payer: PRIVATE HEALTH INSURANCE | Admitting: Cardiology

## 2019-10-20 VITALS — BP 124/94 | HR 117 | Ht 66.75 in | Wt 199.0 lb

## 2019-10-20 DIAGNOSIS — R002 Palpitations: Secondary | ICD-10-CM

## 2019-10-20 DIAGNOSIS — R000 Tachycardia, unspecified: Secondary | ICD-10-CM

## 2019-10-20 NOTE — Progress Notes (Signed)
Cardiology Office Note:    Date:  10/20/2019   ID:  Mallie Darting, DOB 11-27-1995, MRN [id number]  PCP:  Harlan Stains, MD  Cardiologist:  Jenne Campus, MD    Referring MD: Harlan Stains, MD   Chief Complaint  Patient presents with  . Follow-up    History of Present Illness:    Michelle Robles is a 24 y.o. female have seated initially for palpitations.  She is aware of her heart speeding up.  She did wear a monitor which showed only sinus tachycardia, no pathological rhythm, QTc interval during the monitor was normal.  She did multiple times triggered events which showed sinus tachycardia.  As a part of evaluation then she had echocardiogram done which showed normal left ventricle ejection fraction.  She comes today to my office for follow-up doing well described to have some palpitations but much better than before.  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  Past Medical History:  Diagnosis Date  . Anxiety state 12/01/2014  . 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 ADENOIDECTOMY Bilateral 2009    Current Medications: Current Meds  Medication Sig  . atomoxetine (STRATTERA) 40 MG capsule Take 40 mg by mouth daily.  Marland Kitchen lamoTRIgine (LAMICTAL) 100 MG tablet Stop after 14 days  . lurasidone (LATUDA) 40 MG TABS tablet Take 1 tablet (40 mg total) by mouth daily with breakfast for 30 days.  . traZODone (DESYREL) 100 MG tablet Take 1/2-1 tab po qd     Allergies:   Other and Sumatriptan   Social History   Socioeconomic History  . Marital status: Single    Spouse name: Not on file  . Number of children: Not on file  . Years of education: Not on file  . Highest education level: Not on file  Occupational History  . Not on file  Tobacco Use  . Smoking status: Current Every Day Smoker    Types: E-cigarettes   . Smokeless tobacco: Never Used  Substance and Sexual Activity  . Alcohol use: Yes    Comment: 1-2 times a week  . Drug use: No  . Sexual activity: Never    Birth control/protection: Abstinence  Other Topics Concern  . Not on file  Social History Narrative   Wonda is in twelfth grade at [employer]. She is doing very well.    Living with her parents and brother. Works part-time at [employer].   Social Determinants of Health   Financial Resource Strain:   . Difficulty of Paying Living Expenses: Not on file  Food Insecurity:   . Worried About Charity fundraiser in the Last Year: Not on file  . Ran Out of Food in the Last Year: Not on file  Transportation Needs:   . Lack of Transportation (Medical): Not on file  . Lack of Transportation (Non-Medical): Not on file  Physical Activity:   . Days of Exercise per Week: Not on file  . Minutes of Exercise per Session: Not on file  Stress:   . Feeling of Stress : Not on file  Social Connections:   . Frequency of Communication with Friends and Family: Not on file  . Frequency of Social Gatherings with Friends and Family: Not on file  . Attends Religious Services: Not on file  . Active Member of Clubs or Organizations: Not  on file  . Attends Banker Meetings: Not on file  . Marital Status: Not on file     Family History: The patient's family history includes ADD / ADHD in her father and paternal grandfather; Anxiety disorder in her mother; Asperger's syndrome in her maternal uncle; Dementia in her paternal grandfather; Depression in her father; Heart attack in her maternal grandmother; Stroke in her maternal grandmother. ROS:   Please see the history of present illness.    All 14 point review of systems negative except as described per history of present illness  EKGs/Labs/Other Studies Reviewed:      Recent Labs: No results found for requested labs within last 8760 hours.  Recent Lipid Panel No  results found for: CHOL, TRIG, HDL, CHOLHDL, VLDL, LDLCALC, LDLDIRECT  Physical Exam:    VS:  BP (!) 124/94 (BP Location: Left Arm, Patient Position: Sitting, Cuff Size: Normal)   Pulse (!) 117   Ht 5' 6.75" (1.695 m)   Wt 199 lb (90.3 kg)   SpO2 99%   BMI 31.40 kg/m     Wt Readings from Last 3 Encounters:  10/20/19 199 lb (90.3 kg)  01/29/18 138 lb 6.4 oz (62.8 kg)  07/13/15 139 lb 12.8 oz (63.4 kg) (76 %, Z= 0.69)*   * Growth percentiles are based on CDC (Girls, 2-20 Years) data.     GEN:  Well nourished, well developed in no acute distress HEENT: Normal NECK: No JVD; No carotid bruits LYMPHATICS: No lymphadenopathy CARDIAC: RRR, no murmurs, no rubs, no gallops RESPIRATORY:  Clear to auscultation without rales, wheezing or rhonchi  ABDOMEN: Soft, non-tender, non-distended MUSCULOSKELETAL:  No edema; No deformity  SKIN: Warm and dry LOWER EXTREMITIES: no swelling NEUROLOGIC:  Alert and oriented x 3 PSYCHIATRIC:  Normal affect   ASSESSMENT:    1. Palpitations   2. Tachycardia    PLAN:    In order of problems listed above:  1. Palpitations relatively controlled.  I do not think we need to use any medication for this situation prior work-up was negative. 2. Tachycardia I advised her to start exercising on a regular basis that should be able to control her sinus node better. 3. QT prolonging medication reviewed.  No syncope no dizziness no passing out.  I asked her to have EKG done EKG showed normal QT corrected interval.   Medication Adjustments/Labs and Tests Ordered: Current medicines are reviewed at length with the patient today.  Concerns regarding medicines are outlined above.  No orders of the defined types were placed in this encounter.  Medication changes: No orders of the defined types were placed in this encounter.   Signed, Georgeanna Lea, MD, [facility] 10/20/2019 4:35 PM    [facility]

## 2019-10-20 NOTE — Patient Instructions (Signed)
Medication Instructions:  none *If you need a refill on your cardiac medications before your next appointment, please call your pharmacy*  Lab Work: none If you have labs (blood work) drawn today and your tests are completely normal, you will receive your results only by: Marland Kitchen MyChart Message (if you have MyChart) OR . A paper copy in the mail If you have any lab test that is abnormal or we need to change your treatment, we will call you to review the results.  Testing/Procedures: none  Follow-Up: At [facility], you and your health needs are our priority.  As part of our continuing mission to provide you with exceptional heart care, we have created designated Provider Care Teams.  These Care Teams include your primary Cardiologist (physician) and Advanced Practice Providers (APPs -  Physician Assistants and Nurse Practitioners) who all work together to provide you with the care you need, when you need it.  Your next appointment:   1 year(s)  The format for your next appointment:   Either In Person or Virtual  Provider:   Gypsy Balsam, MD  Other Instructions  Stay Well
